# Patient Record
Sex: Female | Born: 2005 | Race: White | Hispanic: No | Marital: Single | Smoking: Never smoker
Health system: Southern US, Community
[De-identification: ages and names within clinical notes are randomized; demographics above are authoritative.]

## PROBLEM LIST (undated history)

## (undated) DIAGNOSIS — T7840XA Allergy, unspecified, initial encounter: Secondary | ICD-10-CM

## (undated) DIAGNOSIS — H539 Unspecified visual disturbance: Secondary | ICD-10-CM

## (undated) DIAGNOSIS — F419 Anxiety disorder, unspecified: Secondary | ICD-10-CM

## (undated) DIAGNOSIS — T391X2A Poisoning by 4-Aminophenol derivatives, intentional self-harm, initial encounter: Secondary | ICD-10-CM

## (undated) DIAGNOSIS — J45909 Unspecified asthma, uncomplicated: Secondary | ICD-10-CM

## (undated) DIAGNOSIS — F331 Major depressive disorder, recurrent, moderate: Secondary | ICD-10-CM

## (undated) HISTORY — DX: Unspecified asthma, uncomplicated: J45.909

## (undated) HISTORY — PX: TONSILLECTOMY AND ADENOIDECTOMY: SUR1326

## (undated) HISTORY — DX: Major depressive disorder, recurrent, moderate: F33.1

## (undated) HISTORY — DX: Poisoning by 4-aminophenol derivatives, intentional self-harm, initial encounter: T39.1X2A

---
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
## 2024-02-12 ENCOUNTER — Ambulatory Visit
Admission: EM | Admit: 2024-02-12 | Discharge: 2024-02-12 | Disposition: A | Discharge status: Home / Self Care | Attending: Family Medicine | Admitting: Family Medicine

## 2024-02-12 DIAGNOSIS — J069 Acute upper respiratory infection, unspecified: Secondary | ICD-10-CM | POA: Diagnosis not present

## 2024-02-12 MED ORDER — BENZONATATE 200 MG PO CAPS: 200.0000 mg | ORAL_CAPSULE | Freq: Three times a day (TID) | ORAL | 0 refills | Status: DC | PRN

## 2024-02-12 NOTE — Discharge Instructions (Signed)
 Please treat your symptoms with over the counter tylenol or ibuprofen, humidifier, and rest.  You may take Tessalon 3 times a day as needed for your cough.  Continue your albuterol inhaler as needed for wheezing or shortness of breath.  Viral illnesses can last 7-14 days. Please follow up with your PCP if your symptoms are not improving. Please go to the ER for any worsening symptoms. This includes but is not limited to fever you can not control with tylenol or ibuprofen, you are not able to stay hydrated, you have shortness of breath or chest pain.  Thank you for choosing [facility] for your healthcare needs. I hope you feel better soon!

## 2024-02-12 NOTE — ED Triage Notes (Signed)
 nasal congestion x 4 days right ear pain x 4 days headache x 4 days  Patient has been taking zyrtec d and mucinex with no relief.

## 2024-02-12 NOTE — ED Provider Notes (Signed)
 [facility]    CSN: [id number] Arrival date & time: 02/12/24  1552      History   Chief Complaint Chief Complaint  Patient presents with   Otalgia   Nasal Congestion    HPI Michelle Berg is a 18 y.o. female  presents for evaluation of URI symptoms for 4 days. Patient reports associated symptoms of cough, congestion, sore throat, ear pain, headaches. Denies N/V/D, fevers, body aches, shortness of breath. Patient does have a hx of asthma.  Has an albuterol inhaler but has not needed to use since symptom onset.  Patient is not an active smoker.   Reports  sick contacts via friends.  Pt has taken allergy medicine and Mucinex OTC for symptoms. Pt has no other concerns at this time.    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   MDD (major depressive disorder), recurrent episode, severe (HCC) 01/25/2021   MDD (major depressive disorder), recurrent severe, without psychosis (HCC) 01/19/2020   MDD (major depressive disorder), recurrent severe, without psychosis (HCC) 01/19/2020   MDD (major depressive disorder), recurrent severe, without psychosis (HCC) 01/19/2020   Suicide attempt by acetaminophen overdose (HCC) 05/30/2020   Suicide attempt by drug ingestion (HCC) 05/30/2020   Suicide ideation 03/22/2020   Tylenol overdose, intentional self-harm, initial encounter (HCC) 05/28/2018   Vision abnormalities     Patient Active Problem List   Diagnosis Date Noted   Scabies infestation 05/03/2022   Recurrent major depressive disorder, in partial remission (HCC) 09/27/2019   Allergic dermatitis 06/11/2019   Generalized anxiety disorder 06/10/2018    Dysmenorrhea in adolescent 05/19/2018   Asthma, mild intermittent, well-controlled 04/08/2013    Past Surgical History:  Procedure Laterality Date   TONSILLECTOMY AND ADENOIDECTOMY      OB History     Gravida  0   Para  0   Term  0   Preterm  0   AB  0   Living  0      SAB  0   IAB  0   Ectopic  0   Multiple  0   Live Births  0            Home Medications    Prior to Admission medications   Medication Sig Start Date End Date Taking? Authorizing Provider  benzonatate (TESSALON) 200 MG capsule Take 1 capsule (200 mg total) by mouth 3 (three) times daily as needed. 02/12/24  Yes Radford Pax, NP  hydrOXYzine (ATARAX) 25 MG tablet Take 1 tablet (25 mg total) by mouth at bedtime as needed and may repeat dose one time if needed for anxiety. 04/05/22   Darcel Smalling, MD  NORETHIN-ETH ESTRAD-FE BIPHAS PO Take by mouth.    [provider]  sertraline (ZOLOFT) 50 MG tablet Take 1 tablet (50 mg total) by mouth daily. To be taken with Zoloft 100 mg daily. 05/07/23   Darcel Smalling, MD    Family History Family History  Problem Relation Age of Onset   Anxiety disorder Father    Depression Father    Long QT syndrome Paternal Grandmother    Heart failure Paternal Grandmother  Diabetes Paternal Grandmother    Hyperlipidemia Paternal Grandmother    Hypertension Paternal Grandmother    Breast cancer Maternal Grandmother 30   Hypertension Maternal Grandmother    Lung cancer Maternal Grandfather    Diabetes Paternal Grandfather    COPD Paternal Grandfather     Social History Social History   Tobacco Use   Smoking status: Never   Smokeless tobacco: Never  Vaping Use   Vaping status: Former   Substances: THC  Substance Use Topics   Alcohol use: Never    Alcohol/week: 0.0 standard drinks of alcohol   Drug use: Yes    Types: Marijuana     Allergies   Other, Eucalyptus oil, Latex, and Lavender oil   Review of Systems Review of Systems   HENT:  Positive for congestion, ear pain and sore throat.   Respiratory:  Positive for cough.   Neurological:  Positive for headaches.     Physical Exam Triage Vital Signs ED Triage Vitals  Encounter Vitals Group     BP 02/12/24 1610 99/65     Systolic BP Percentile --      Diastolic BP Percentile --      Pulse Rate 02/12/24 1610 76     Resp 02/12/24 1610 17     Temp 02/12/24 1610 98.4 F (36.9 C)     Temp Source 02/12/24 1610 Oral     SpO2 02/12/24 1610 96 %     Weight --      Height --      Head Circumference --      Peak Flow --      Pain Score 02/12/24 1609 8     Pain Loc --      Pain Education --      Exclude from Growth Chart --    No data found.  Updated Vital Signs BP 99/65 (BP Location: Right Arm)   Pulse 76   Temp 98.4 F (36.9 C) (Oral)   Resp 17   LMP 01/26/2024 (Approximate)   SpO2 96%   Visual Acuity Right Eye Distance:   Left Eye Distance:   Bilateral Distance:    Right Eye Near:   Left Eye Near:    Bilateral Near:     Physical Exam Vitals and nursing note reviewed.  Constitutional:      General: She is not in acute distress.    Appearance: She is well-developed. She is not ill-appearing.  HENT:     Head: Normocephalic and atraumatic.     Right Ear: Tympanic membrane and ear canal normal.     Left Ear: Tympanic membrane and ear canal normal.     Nose: Congestion present.     Mouth/Throat:     Mouth: Mucous membranes are moist.     Pharynx: Oropharynx is clear. Uvula midline. Posterior oropharyngeal erythema present.     Tonsils: No tonsillar exudate or tonsillar abscesses.  Eyes:     Conjunctiva/sclera: Conjunctivae normal.     Pupils: Pupils are equal, round, and reactive to light.  Cardiovascular:     Rate and Rhythm: Normal rate and regular rhythm.     Heart sounds: Normal heart sounds.  Pulmonary:     Effort: Pulmonary effort is normal.     Breath sounds: Normal breath sounds. No wheezing or rhonchi.  Musculoskeletal:      Cervical back: Normal range of motion and neck supple.  Lymphadenopathy:     Cervical: No cervical adenopathy.  Skin:    General: Skin  is warm and dry.  Neurological:     General: No focal deficit present.     Mental Status: She is alert and oriented to person, place, and time.  Psychiatric:        Mood and Affect: Mood normal.        Behavior: Behavior normal.      UC Treatments / Results  Labs (all labs ordered are listed, but only abnormal results are displayed) Labs Reviewed - No data to display  EKG   Radiology No results found.  Procedures Procedures (including critical care time)  Medications Ordered in UC Medications - No data to display  Initial Impression / Assessment and Plan / UC Course  I have reviewed the triage vital signs and the nursing notes.  Pertinent labs & imaging results that were available during my care of the patient were reviewed by me and considered in my medical decision making (see chart for details).     Reviewed exam and symptoms with patient.  No red flags.  She declines COVID and flu testing.  Discussed viral illness with symptomatic treatment.  No sign of asthma exacerbation on exam.  Will do Tessalon.  Cough.  Continue albuterol inhaler..  Discussed rest fluids and PCP follow-up symptoms do not improve.  ER precautions reviewed. Final Clinical Impressions(s) / UC Diagnoses   Final diagnoses:  Viral upper respiratory tract infection     Discharge Instructions      Please treat your symptoms with over the counter tylenol or ibuprofen, humidifier, and rest.  You may take Tessalon 3 times a day as needed for your cough.  Continue your albuterol inhaler as needed for wheezing or shortness of breath.  Viral illnesses can last 7-14 days. Please follow up with your PCP if your symptoms are not improving. Please go to the ER for any worsening symptoms. This includes but is not limited to fever you can not control with tylenol or ibuprofen,  you are not able to stay hydrated, you have shortness of breath or chest pain.  Thank you for choosing [facility] for your healthcare needs. I hope you feel better soon!     ED Prescriptions     Medication Sig Dispense Auth. Provider   benzonatate (TESSALON) 200 MG capsule Take 1 capsule (200 mg total) by mouth 3 (three) times daily as needed. 20 capsule Radford Pax, NP      PDMP not reviewed this encounter.   Radford Pax, NP 02/12/24 [phone]

## 2024-03-23 ENCOUNTER — Ambulatory Visit (INDEPENDENT_AMBULATORY_CARE_PROVIDER_SITE_OTHER)

## 2024-03-23 ENCOUNTER — Ambulatory Visit
Admission: EM | Admit: 2024-03-23 | Discharge: 2024-03-23 | Disposition: A | Discharge status: Home / Self Care | Attending: Physician Assistant | Admitting: Physician Assistant

## 2024-03-23 DIAGNOSIS — M25572 Pain in left ankle and joints of left foot: Secondary | ICD-10-CM | POA: Diagnosis not present

## 2024-03-23 DIAGNOSIS — M79672 Pain in left foot: Secondary | ICD-10-CM | POA: Diagnosis not present

## 2024-03-23 DIAGNOSIS — S93402A Sprain of unspecified ligament of left ankle, initial encounter: Secondary | ICD-10-CM

## 2024-03-23 DIAGNOSIS — S93602A Unspecified sprain of left foot, initial encounter: Secondary | ICD-10-CM

## 2024-03-23 NOTE — ED Provider Notes (Signed)
 [facility]    CSN: [id number] Arrival date & time: 03/23/24  1927      History   Chief Complaint Chief Complaint  Patient presents with   Foot Injury    HPI Michelle Berg is a 18 y.o. female presenting for left foot and ankle pain after tripping and falling into a hole and then falling to the ground yesterday.  Patient says most of the pain is throughout the foot.  Not reporting any numbness or weakness.  Able to bear weight but is limping.  She has taken Tylenol  for pain without much relief.  No history of previous fracture.  Denies any other injuries.  No other complaints.  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   MDD (major depressive disorder), recurrent episode, severe (HCC) 01/25/2021   MDD (major depressive disorder), recurrent severe, without psychosis (HCC) 01/19/2020   MDD (major depressive disorder), recurrent severe, without psychosis (HCC) 01/19/2020   MDD (major depressive disorder), recurrent severe, without psychosis (HCC) 01/19/2020   Suicide attempt by acetaminophen  overdose (HCC) 05/30/2020   Suicide attempt by drug ingestion (HCC) 05/30/2020   Suicide ideation 03/22/2020   Tylenol  overdose, intentional self-harm, initial encounter (HCC) 05/28/2018   Vision abnormalities     Patient Active Problem List   Diagnosis Date Noted   Scabies infestation 05/03/2022   Recurrent major depressive disorder, in partial remission (HCC) 09/27/2019   Allergic dermatitis 06/11/2019   Generalized anxiety disorder 06/10/2018   Dysmenorrhea in adolescent 05/19/2018   Asthma, mild intermittent, well-controlled 04/08/2013    Past Surgical History:  Procedure Laterality Date   TONSILLECTOMY AND  ADENOIDECTOMY      OB History     Gravida  0   Para  0   Term  0   Preterm  0   AB  0   Living  0      SAB  0   IAB  0   Ectopic  0   Multiple  0   Live Births  0            Home Medications    Prior to Admission medications   Medication Sig Start Date End Date Taking? Authorizing Provider  NORETHIN -ETH ESTRAD-FE BIPHAS PO Take by mouth.    [provider]  sertraline  (ZOLOFT ) 50 MG tablet Take 1 tablet (50 mg total) by mouth daily. To be taken with Zoloft  100 mg daily. 05/07/23   Pilar Bridge, MD    Family History Family History  Problem Relation Age of Onset   Anxiety disorder Father    Depression Father    Long QT syndrome Paternal Grandmother    Heart failure Paternal Grandmother    Diabetes Paternal Grandmother    Hyperlipidemia Paternal Grandmother    Hypertension Paternal Grandmother    Breast cancer Maternal Grandmother 30   Hypertension Maternal Grandmother    Lung cancer Maternal Grandfather    Diabetes Paternal Grandfather    COPD Paternal Grandfather     Social History Social History   Tobacco Use   Smoking status: Never   Smokeless tobacco: Never  Vaping Use   Vaping status: Former   Substances: THC  Substance Use Topics   Alcohol use: Never  Alcohol/week: 0.0 standard drinks of alcohol   Drug use: Yes    Types: Marijuana     Allergies   Other, Eucalyptus oil, Latex, and Lavender oil   Review of Systems Review of Systems  Musculoskeletal:  Positive for arthralgias, gait problem and joint swelling.  Skin:  Negative for color change and wound.  Neurological:  Negative for weakness and numbness.     Physical Exam Triage Vital Signs ED Triage Vitals  Encounter Vitals Group     BP 03/23/24 1933 112/75     Systolic BP Percentile --      Diastolic BP Percentile --      Pulse Rate 03/23/24 1933 86     Resp 03/23/24 1933 16     Temp 03/23/24 1933 98.3 F (36.8 C)     Temp Source 03/23/24 1933 Oral      SpO2 03/23/24 1933 97 %     Weight --      Height --      Head Circumference --      Peak Flow --      Pain Score 03/23/24 1932 8     Pain Loc --      Pain Education --      Exclude from Growth Chart --    No data found.  Updated Vital Signs BP 112/75 (BP Location: Right Arm)   Pulse 86   Temp 98.3 F (36.8 C) (Oral)   Resp 16   LMP 03/15/2024 (Exact Date)   SpO2 97%       Physical Exam Vitals and nursing note reviewed.  Constitutional:      General: She is not in acute distress.    Appearance: Normal appearance. She is not ill-appearing or toxic-appearing.  HENT:     Head: Normocephalic and atraumatic.  Eyes:     General: No scleral icterus.       Right eye: No discharge.        Left eye: No discharge.     Conjunctiva/sclera: Conjunctivae normal.  Cardiovascular:     Rate and Rhythm: Normal rate and regular rhythm.  Pulmonary:     Effort: Pulmonary effort is normal. No respiratory distress.  Musculoskeletal:     Cervical back: Neck supple.     Left ankle: Swelling (mild lateral ankle) present. No deformity or ecchymosis. Tenderness present over the lateral malleolus, medial malleolus and ATF ligament. Normal range of motion. Normal pulse.     Left Achilles Tendon: No tenderness.     Left foot: Normal range of motion. Swelling (mild dorsal foot) and tenderness (generalized tenderness) present. Normal pulse.  Skin:    General: Skin is dry.  Neurological:     General: No focal deficit present.     Mental Status: She is alert. Mental status is at baseline.     Motor: No weakness.     Gait: Gait abnormal.  Psychiatric:        Mood and Affect: Mood normal.        Behavior: Behavior normal.      UC Treatments / Results  Labs (all labs ordered are listed, but only abnormal results are displayed) Labs Reviewed - No data to display  EKG   Radiology DG Foot Complete Left Result Date: 03/23/2024 CLINICAL DATA:  Fall, foot and ankle pain EXAM: LEFT FOOT -  COMPLETE 3+ VIEW COMPARISON:  None Available. FINDINGS: There is no evidence of fracture or dislocation. There is no evidence of arthropathy or other focal bone  abnormality. Soft tissues are unremarkable. IMPRESSION: Negative. Electronically Signed   By: Janeece Mechanic M.D.   On: 03/23/2024 19:54   DG Ankle Complete Left Result Date: 03/23/2024 CLINICAL DATA:  Fall, foot and ankle pain EXAM: LEFT ANKLE COMPLETE - 3+ VIEW COMPARISON:  None Available. FINDINGS: There is no evidence of fracture, dislocation, or joint effusion. There is no evidence of arthropathy or other focal bone abnormality. Soft tissues are unremarkable. IMPRESSION: Negative. Electronically Signed   By: Janeece Mechanic M.D.   On: 03/23/2024 19:53    Procedures Procedures (including critical care time)  Medications Ordered in UC Medications - No data to display  Initial Impression / Assessment and Plan / UC Course  I have reviewed the triage vital signs and the nursing notes.  Pertinent labs & imaging results that were available during my care of the patient were reviewed by me and considered in my medical decision making (see chart for details).   18 year old female presenting for left foot and ankle pain after tripping in a hole yesterday.  Has been taking Tylenol .  Able to bear weight but it hurts to do so.  Most pain of the foot inside of the ankle.  No history of previous fracture.  X-ray of foot and ankle obtained. Negative imaging.   Patient given brace for ankle/foot.  Reviewed RICE guidelines.  Discussed continuing NSAIDs and Tylenol  as needed for pain. Reviewed return and ortho follow up precautions.   Final Clinical Impressions(s) / UC Diagnoses   Final diagnoses:  Pain of joint of left ankle and foot  Sprain of left foot, initial encounter  Sprain of left ankle, unspecified ligament, initial encounter     Discharge Instructions      -No fractures  SPRAIN: Stressed avoiding painful activities . Reviewed  RICE guidelines. Use medications as directed, including NSAIDs. If no NSAIDs have been prescribed for you today, you may take Aleve or Motrin  over the counter. May use Tylenol  in between doses of NSAIDs.  If no improvement in the next 1-2 weeks, f/u with PCP or return to our office for reexamination, and please feel free to call or return at any time for any questions or concerns you may have and we will be happy to help you!         ED Prescriptions   None    PDMP not reviewed this encounter.   Floydene Hy, PA-C 03/23/24 2011

## 2024-03-23 NOTE — Discharge Instructions (Signed)
-  No  fractures!  SPRAIN: Stressed avoiding painful activities . Reviewed RICE guidelines. Use medications as directed, including NSAIDs. If no NSAIDs have been prescribed for you today, you may take Aleve or Motrin over the counter. May use Tylenol in between doses of NSAIDs.  If no improvement in the next 1-2 weeks, f/u with PCP or return to our office for reexamination, and please feel free to call or return at any time for any questions or concerns you may have and we will be happy to help you!

## 2024-03-23 NOTE — ED Triage Notes (Signed)
 Left foot injury. Patient fell in a hole and injured her left foot.

## 2024-03-24 ENCOUNTER — Ambulatory Visit

## 2024-03-24 ENCOUNTER — Encounter: Payer: Self-pay | Admitting: Emergency Medicine

## 2024-03-24 ENCOUNTER — Ambulatory Visit
Admission: EM | Admit: 2024-03-24 | Discharge: 2024-03-24 | Disposition: A | Discharge status: Home / Self Care | Attending: Family Medicine | Admitting: Family Medicine

## 2024-03-24 DIAGNOSIS — M79672 Pain in left foot: Secondary | ICD-10-CM

## 2024-03-24 NOTE — ED Provider Notes (Addendum)
 [facility]    CSN: [id number] Arrival date & time: 03/24/24  0934      History   Chief Complaint No chief complaint on file.   HPI Michelle Berg is a 18 y.o. female.   Patient presents for re-evaluation of her left foot after she accidentally stepped into a hole while playing with water guns two days ago.  States she is unclear if she plantar flexed into the hole or everted/inverted her ankle.  Reports pain that has persisted and worsened since the incident.  Was evaluated here last night with unremarkable foot/ankle x-rays.  Has been wearing an ankle brace and taking ibuprofen  without any improvement in the symptoms.  She is having difficulty walking now due to the discomfort.  No other injuries reported.  The history is provided by the patient and a caregiver.    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   MDD (major depressive disorder), recurrent episode, severe (HCC) 01/25/2021   MDD (major depressive disorder), recurrent severe, without psychosis (HCC) 01/19/2020   MDD (major depressive disorder), recurrent severe, without psychosis (HCC) 01/19/2020   MDD (major depressive disorder), recurrent severe, without psychosis (HCC) 01/19/2020   Suicide attempt by acetaminophen  overdose (HCC) 05/30/2020   Suicide attempt by drug ingestion (HCC) 05/30/2020   Suicide ideation 03/22/2020   Tylenol  overdose, intentional self-harm, initial encounter (HCC) 05/28/2018   Vision abnormalities     Patient Active Problem List   Diagnosis Date Noted   Scabies infestation 05/03/2022   Recurrent major depressive disorder, in partial remission (HCC) 09/27/2019   Allergic dermatitis 06/11/2019   Generalized anxiety disorder  06/10/2018   Dysmenorrhea in adolescent 05/19/2018   Asthma, mild intermittent, well-controlled 04/08/2013    Past Surgical History:  Procedure Laterality Date   TONSILLECTOMY AND ADENOIDECTOMY      OB History     Gravida  0   Para  0   Term  0   Preterm  0   AB  0   Living  0      SAB  0   IAB  0   Ectopic  0   Multiple  0   Live Births  0            Home Medications    Prior to Admission medications   Not on File    Family History Family History  Problem Relation Age of Onset   Anxiety disorder Father    Depression Father    Long QT syndrome Paternal Grandmother    Heart failure Paternal Grandmother    Diabetes Paternal Grandmother    Hyperlipidemia Paternal Grandmother    Hypertension Paternal Grandmother    Breast cancer Maternal Grandmother 30   Hypertension Maternal Grandmother    Lung cancer Maternal Grandfather    Diabetes Paternal Grandfather    COPD Paternal Grandfather     Social History Social History   Tobacco Use   Smoking status: Never   Smokeless tobacco: Never  Vaping Use   Vaping status: Former   Substances: THC  Substance Use Topics   Alcohol use: Never    Alcohol/week: 0.0 standard drinks of alcohol   Drug use: Yes    Types: Marijuana  Allergies   Other, Cinnamon, Eucalyptus oil, Latex, and Lavender oil   Review of Systems Review of Systems  Constitutional:  Negative for chills, fatigue and fever.  HENT:  Negative for congestion, rhinorrhea and sore throat.   Respiratory:  Negative for cough and shortness of breath.   Cardiovascular:  Negative for chest pain and palpitations.  Gastrointestinal:  Negative for abdominal pain, diarrhea and vomiting.  Genitourinary:  Negative for dysuria and flank pain.  Musculoskeletal:  Positive for arthralgias and myalgias.  Skin:  Negative for rash and wound.  Neurological:  Negative for dizziness and headaches.     Physical Exam Triage Vital Signs ED Triage  Vitals  Encounter Vitals Group     BP 03/24/24 0938 112/76     Systolic BP Percentile --      Diastolic BP Percentile --      Pulse Rate 03/24/24 0938 66     Resp 03/24/24 0938 18     Temp 03/24/24 0938 98.3 F (36.8 C)     Temp Source 03/24/24 0938 Oral     SpO2 03/24/24 0938 95 %     Weight 03/24/24 0937 146 lb (66.2 kg)     Height --      Head Circumference --      Peak Flow --      Pain Score 03/24/24 0939 8     Pain Loc --      Pain Education --      Exclude from Growth Chart --    No data found.  Updated Vital Signs BP 112/76 (BP Location: Right Arm)   Pulse 66   Temp 98.3 F (36.8 C) (Oral)   Resp 18   Wt 146 lb (66.2 kg)   LMP 03/09/2024 (Exact Date)   SpO2 95%   Visual Acuity Right Eye Distance:   Left Eye Distance:   Bilateral Distance:    Right Eye Near:   Left Eye Near:    Bilateral Near:     Physical Exam Vitals and nursing note reviewed.  Constitutional:      Appearance: Normal appearance.  HENT:     Head: Normocephalic.  Cardiovascular:     Rate and Rhythm: Normal rate and regular rhythm.  Pulmonary:     Effort: Pulmonary effort is normal.     Breath sounds: Normal breath sounds.  Abdominal:     General: Bowel sounds are normal.  Musculoskeletal:     Left foot: Decreased range of motion. Tenderness present. Normal pulse.     Comments: Left Foot: Reports pain to the lateral aspect of the foot and all five toes - reduced range of motion d/t the discomfort both with movement and palpation.  No external signs of trauma such as ecchymosis or abrasions.  Limited ability to bear weight.  Neurovascular status is intact distally.  Skin:    General: Skin is warm and dry.  Neurological:     General: No focal deficit present.     Mental Status: She is alert and oriented to person, place, and time.  Psychiatric:        Mood and Affect: Mood normal.        Behavior: Behavior normal.        Thought Content: Thought content normal.        Judgment:  Judgment normal.      UC Treatments / Results  Labs (all labs ordered are listed, but only abnormal results are displayed) Labs Reviewed - No data to display  EKG   Radiology DG Foot Complete Left Result Date: 03/23/2024 CLINICAL DATA:  Fall, foot and ankle pain EXAM: LEFT FOOT - COMPLETE 3+ VIEW COMPARISON:  None Available. FINDINGS: There is no evidence of fracture or dislocation. There is no evidence of arthropathy or other focal bone abnormality. Soft tissues are unremarkable. IMPRESSION: Negative. Electronically Signed   By: Janeece Mechanic M.D.   On: 03/23/2024 19:54   DG Ankle Complete Left Result Date: 03/23/2024 CLINICAL DATA:  Fall, foot and ankle pain EXAM: LEFT ANKLE COMPLETE - 3+ VIEW COMPARISON:  None Available. FINDINGS: There is no evidence of fracture, dislocation, or joint effusion. There is no evidence of arthropathy or other focal bone abnormality. Soft tissues are unremarkable. IMPRESSION: Negative. Electronically Signed   By: Janeece Mechanic M.D.   On: 03/23/2024 19:53    Procedures Procedures (including critical care time)  Medications Ordered in UC Medications - No data to display  Initial Impression / Assessment and Plan / UC Course  I have reviewed the triage vital signs and the nursing notes.  Pertinent labs & imaging results that were available during my care of the patient were reviewed by me and considered in my medical decision making (see chart for details).    Patient presents requesting re-evaluation of her left foot after sustaining an injury two days ago.  X-rays of the left foot/ankle yesterday here in our clinic did not reveal any acute injuries.  She has been using an ankle brace and ibuprofen  for pain control but is still having discomfort and difficulty walking.  Reviewed the need for continued care with orthopedics - they have chosen to present at [facility] here in [place] upon leaving our clinic.  Patient was assisted to the car in a  wheelchair.   Final Clinical Impressions(s) / UC Diagnoses   Final diagnoses:  Acute foot pain, left     Discharge Instructions      Please proceed to [facility] for ongoing orthopedic care.     ED Prescriptions   None    PDMP not reviewed this encounter.   Genene Kennel, FNP 03/24/24 1017    Genene Kennel, FNP 03/24/24 1018

## 2024-03-24 NOTE — Discharge Instructions (Addendum)
 Please proceed to [facility] for ongoing orthopedic care.

## 2024-03-24 NOTE — ED Triage Notes (Signed)
 Michelle Berg in a hole yesterday.  Pain to ball of left foot.  States was seen at Urgent Care in [facility] and they x-rayed her ankle and not her foot.

## 2024-04-05 ENCOUNTER — Other Ambulatory Visit: Payer: Self-pay

## 2024-04-05 ENCOUNTER — Other Ambulatory Visit (HOSPITAL_COMMUNITY): Payer: Self-pay | Admitting: Orthopaedic Surgery

## 2024-04-05 DIAGNOSIS — S93602A Unspecified sprain of left foot, initial encounter: Secondary | ICD-10-CM | POA: Diagnosis not present

## 2024-04-05 MED ORDER — DICLOFENAC SODIUM 1 % EX GEL
2.0000 g | Freq: Four times a day (QID) | CUTANEOUS | 1 refills | Status: AC
  Filled 2024-04-05: qty 100, 50d supply, fill #0
  Filled 2024-04-05: qty 100, 30d supply, fill #0

## 2024-04-05 MED ORDER — MELOXICAM 7.5 MG PO TABS
ORAL_TABLET | ORAL | 0 refills | Status: DC
  Filled 2024-04-05: qty 60, 30d supply, fill #0
  Filled 2024-04-05: qty 60, 44d supply, fill #0

## 2024-04-06 ENCOUNTER — Other Ambulatory Visit: Payer: Self-pay

## 2024-04-08 ENCOUNTER — Ambulatory Visit (HOSPITAL_COMMUNITY)
Admission: RE | Admit: 2024-04-08 | Discharge: 2024-04-08 | Disposition: A | Discharge status: Home / Self Care | Source: Ambulatory Visit | Attending: Orthopaedic Surgery | Admitting: Orthopaedic Surgery

## 2024-04-08 DIAGNOSIS — S93602A Unspecified sprain of left foot, initial encounter: Secondary | ICD-10-CM | POA: Insufficient documentation

## 2024-05-03 ENCOUNTER — Other Ambulatory Visit: Payer: Self-pay

## 2024-05-03 DIAGNOSIS — S93602A Unspecified sprain of left foot, initial encounter: Secondary | ICD-10-CM | POA: Diagnosis not present

## 2024-05-03 MED ORDER — MELOXICAM 7.5 MG PO TABS
7.5000 mg | ORAL_TABLET | Freq: Every day | ORAL | 0 refills | Status: AC | PRN
  Filled 2024-05-03: qty 30, 30d supply, fill #0

## 2024-05-11 ENCOUNTER — Encounter: Payer: Self-pay | Admitting: Family Medicine

## 2024-05-12 NOTE — Telephone Encounter (Signed)
 Please call and schedule appointment with Dr. Avelina.

## 2024-05-13 ENCOUNTER — Other Ambulatory Visit: Payer: Self-pay

## 2024-05-14 ENCOUNTER — Ambulatory Visit: Admitting: Family Medicine

## 2024-05-18 ENCOUNTER — Ambulatory Visit: Admitting: Family Medicine

## 2024-06-14 ENCOUNTER — Other Ambulatory Visit: Payer: Self-pay

## 2024-06-14 DIAGNOSIS — R112 Nausea with vomiting, unspecified: Secondary | ICD-10-CM | POA: Diagnosis not present

## 2024-06-14 DIAGNOSIS — R197 Diarrhea, unspecified: Secondary | ICD-10-CM | POA: Diagnosis not present

## 2024-06-14 DIAGNOSIS — A059 Bacterial foodborne intoxication, unspecified: Secondary | ICD-10-CM | POA: Diagnosis not present

## 2024-06-14 MED ORDER — ONDANSETRON 4 MG PO TBDP
4.0000 mg | ORAL_TABLET | Freq: Three times a day (TID) | ORAL | 0 refills | Status: AC | PRN
  Filled 2024-06-14: qty 20, 4d supply, fill #0

## 2024-06-14 MED ORDER — DICYCLOMINE HCL 20 MG PO TABS
20.0000 mg | ORAL_TABLET | Freq: Four times a day (QID) | ORAL | 0 refills | Status: AC | PRN
  Filled 2024-06-14: qty 20, 5d supply, fill #0

## 2024-06-15 ENCOUNTER — Other Ambulatory Visit: Payer: Self-pay

## 2024-07-07 ENCOUNTER — Other Ambulatory Visit: Payer: Self-pay

## 2024-07-07 ENCOUNTER — Encounter: Payer: Self-pay | Admitting: Obstetrics

## 2024-07-07 ENCOUNTER — Emergency Department
Admission: EM | Admit: 2024-07-07 | Discharge: 2024-07-07 | Discharge status: Against Medical Advice | Attending: Emergency Medicine | Admitting: Emergency Medicine

## 2024-07-07 DIAGNOSIS — R102 Pelvic and perineal pain: Secondary | ICD-10-CM | POA: Insufficient documentation

## 2024-07-07 DIAGNOSIS — Z5321 Procedure and treatment not carried out due to patient leaving prior to being seen by health care provider: Secondary | ICD-10-CM | POA: Insufficient documentation

## 2024-07-07 LAB — URINALYSIS, ROUTINE W REFLEX MICROSCOPIC
Bilirubin Urine: NEGATIVE
Glucose, UA: NEGATIVE mg/dL
Ketones, ur: 5 mg/dL — AB
Nitrite: NEGATIVE
Protein, ur: 100 mg/dL — AB
Specific Gravity, Urine: 1.028 (ref 1.005–1.030)
pH: 7 (ref 5.0–8.0)

## 2024-07-07 NOTE — ED Triage Notes (Signed)
 C/Opelvic pain x 2 days.  Pain started with onset of menstration. STates vaginal bleeding is heavier than usual and passing clots.

## 2024-07-07 NOTE — ED Notes (Signed)
Patient refusing blood work in [employer].

## 2024-07-08 LAB — POC URINE PREG, ED: Preg Test, Ur: NEGATIVE
